# Patient Record
Sex: Female | Born: 1935 | Race: White | Smoking: Never smoker
Health system: Southern US, Community
[De-identification: ages and names within clinical notes are randomized; demographics above are authoritative.]

## PROBLEM LIST (undated history)

## (undated) DIAGNOSIS — F0280 Dementia in other diseases classified elsewhere without behavioral disturbance: Secondary | ICD-10-CM

## (undated) DIAGNOSIS — G309 Alzheimer's disease, unspecified: Secondary | ICD-10-CM

## (undated) DIAGNOSIS — I4891 Unspecified atrial fibrillation: Secondary | ICD-10-CM

## (undated) DIAGNOSIS — M810 Age-related osteoporosis without current pathological fracture: Secondary | ICD-10-CM

---

## 2013-07-28 ENCOUNTER — Other Ambulatory Visit (HOSPITAL_COMMUNITY): Payer: Self-pay | Admitting: Geriatric Medicine

## 2013-07-28 DIAGNOSIS — F0280 Dementia in other diseases classified elsewhere without behavioral disturbance: Secondary | ICD-10-CM

## 2013-07-28 DIAGNOSIS — R1310 Dysphagia, unspecified: Secondary | ICD-10-CM

## 2013-08-01 ENCOUNTER — Ambulatory Visit (HOSPITAL_COMMUNITY)
Admission: RE | Admit: 2013-08-01 | Discharge: 2013-08-01 | Disposition: A | Discharge status: Home / Self Care | Payer: Medicare Other | Source: Ambulatory Visit | Attending: Geriatric Medicine | Admitting: Geriatric Medicine

## 2013-08-01 DIAGNOSIS — F0280 Dementia in other diseases classified elsewhere without behavioral disturbance: Secondary | ICD-10-CM

## 2013-08-01 DIAGNOSIS — R1310 Dysphagia, unspecified: Secondary | ICD-10-CM

## 2013-08-01 DIAGNOSIS — G309 Alzheimer's disease, unspecified: Secondary | ICD-10-CM | POA: Insufficient documentation

## 2013-08-01 NOTE — Procedures (Signed)
Objective Swallowing Evaluation: Modified Barium Swallowing Study  Patient Details  Name: Paige Barton MRN: [id number] Date of Birth: 02/13/1925  Today's Date: 08/01/2013 Time: 8469-6295 SLP Time Calculation (min): 32 min  Past Medical History: No past medical history on file. Past Surgical History: No past surgical history on file. HPI:  79 yo pt who resides at [facility] presents for MBS due to concerns pt is having dysphagia/aspiration as pt frequently coughs with liquids.  Per med tech [place], pt eats only small amounts at a time and has been on nutrition risk recently - she has recently stabilized per  Britta Mccreedy.  Pt denies problems swallowing.  Her medical hx includes Alzheimers disease, Afib, right clavicle fx, osteoporosis, HLD.  Pt is on a PPI since 12/25/12 per chart review.   Adequate intake has been on and off for one year per Britta Mccreedy, med tech.      Assessment / Plan / Recommendation Clinical Impression  Dysphagia Diagnosis: Suspected primary esophageal dysphagia;Mild oral phase dysphagia;Mild pharyngeal phase dysphagia Clinical impression: Pt presents with minimal oropharyngeal dysphagia and suspected primary esophageal deficits.  Delayed oral transiting was intermittently present and pt extends head upward to aid oral transit of liquids.  No aspiration or penetration of barium observed and pt observed to clear her throat prior to MBS and cough prior to and during MBS.  Mild pharyngeal stasis noted - most especially with puree/liquids but effectively was minimized with dry and liquid swallows.  Upon further questioning of pt, pt reports she does occasionally have to swallow twice when she senses something in her throat.  During MBS testing when pharynx was clear and distal esophagus appeared with stasis - pt sensed stasis at pharynx - suspect referrant d/t vagal nerve involvement.  Rec to incorporate aspiration/esophageal strategies to mitigate dysphagia/aspiration and  consider dedicated esophageal evaluation.  Skilled intervention included reinforcement of effective compensation strategies and education during testing.  Thanks for this referral.     Treatment Recommendation    consider esophageal work up, continue slp in current environment   Diet Recommendation Regular;Thin liquid   Liquid Administration via: Cup;Straw Medication Administration: Whole meds with puree (or cut/crushed if large and not contraindicated, follow w/liquid) Supervision: Patient able to self feed;Full supervision/cueing for compensatory strategies (initially recommend full supervision for carryover of strategies if able) Compensations: Slow rate;Small sips/bites;Follow solids with liquid (intermittent dry swallow) Postural Changes and/or Swallow Maneuvers: Seated upright 90 degrees;Upright 30-60 min after meal    Other  Recommendations Recommended Consults: Consider esophageal assessment Oral Care Recommendations: Oral care BID   Follow Up Recommendations   (defer to referring slp)           SLP Swallow Goals     General Date of Onset: 08/01/13 HPI: 79 yo pt who resides at [facility] presents for MBS due to concerns pt is having dysphagia/aspiration as pt frequently coughs with liquids.  Per med tech [place], pt eats only small amounts at a time and has been on nutrition risk recently - she has recently stabilized per  Britta Mccreedy.  Pt denies problems swallowing.  Her medical hx includes Alzheimers disease, Afib, right clavicle fx, osteoporosis, HLD.  Pt is on a PPI since 12/25/12 per chart review.   Adequate intake has been on and off for one year per Britta Mccreedy, med tech.  Type of Study: Modified Barium Swallowing Study Reason for Referral: Objectively evaluate swallowing function Diet Prior to this Study: Regular;Thin liquids Temperature Spikes Noted: No Respiratory Status: Room  air History of Recent Intubation: No Behavior/Cognition: Alert;Cooperative;Pleasant  mood Oral Cavity - Dentition: Dentures, top (7 lower teeth) Oral Motor / Sensory Function: Within functional limits Self-Feeding Abilities: Able to feed self Patient Positioning: Upright in chair Baseline Vocal Quality: Clear Volitional Cough: Strong Volitional Swallow: Able to elicit Anatomy: Within functional limits Pharyngeal Secretions: Not observed secondary MBS    Reason for Referral Objectively evaluate swallowing function   Oral Phase Oral Preparation/Oral Phase Oral Phase: Impaired (pt takes very small bites/sips, pt extends head up with liquids, ? to aid transiting, pt states long term habit; piecemeal deglutition- functional,) Oral - Nectar Oral - Nectar Cup: Delayed oral transit Oral - Nectar Straw: Delayed oral transit Oral - Thin Oral - Thin Teaspoon: Delayed oral transit Oral - Thin Cup: Delayed oral transit Oral - Thin Straw: Delayed oral transit Oral - Solids Oral - Puree: Within functional limits (pt took six small bites sequentially) Oral - Regular: Impaired mastication;Delayed oral transit Oral Phase - Comment Oral Phase - Comment: mild oral delay   Pharyngeal Phase Pharyngeal Phase Pharyngeal Phase: Impaired Pharyngeal - Nectar Pharyngeal - Nectar Cup: Within functional limits;Pharyngeal residue - valleculae Pharyngeal - Nectar Straw: Within functional limits;Pharyngeal residue - valleculae Pharyngeal - Thin Pharyngeal - Thin Teaspoon: Within functional limits Pharyngeal - Thin Cup: Within functional limits Pharyngeal - Thin Straw: Within functional limits Pharyngeal - Solids Pharyngeal - Puree: Within functional limits;Pharyngeal residue - valleculae Pharyngeal - Regular: Within functional limits Pharyngeal Phase - Comment Pharyngeal Comment: mild pharyngeal residuals observed intermittently that cleared with further swallows (from piecemealing)  Cervical Esophageal Phase    GO    Cervical Esophageal Phase Cervical Esophageal Phase:  Impaired Cervical Esophageal Phase - Nectar Nectar Cup: Prominent cricopharyngeal segment Nectar Straw: Prominent cricopharyngeal segment Cervical Esophageal Phase - Thin Thin Teaspoon: Prominent cricopharyngeal segment Thin Cup: Prominent cricopharyngeal segment Thin Straw: Prominent cricopharyngeal segment Cervical Esophageal Phase - Comment Cervical Esophageal Comment: Appearance of decreased clearance throughout esophagus with sensation to pharynx intermittently (or not sensed at all).  Liquid and dry swallows aided esophageal clearance marginally but with retrograde propulsion.  Esophagus did not fully clear during entire testing and pt observed to belch after testing.  Pt may benefit from dedicated esophageal evaluation if MD agrees.     Functional Assessment Tool Used: mbs, clinical judgement Functional Limitations: Swallowing Swallow Current Status ([id number]): At least 40 percent but less than 60 percent impaired, limited or restricted Swallow Goal Status [phone]): At least 40 percent but less than 60 percent impaired, limited or restricted Swallow Discharge Status [phone]): At least 40 percent but less than 60 percent impaired, limited or restricted    Donavan Burnet, MS [facility] SLP [phone]

## 2014-03-21 ENCOUNTER — Emergency Department (HOSPITAL_BASED_OUTPATIENT_CLINIC_OR_DEPARTMENT_OTHER): Payer: Medicare Other

## 2014-03-21 ENCOUNTER — Emergency Department (HOSPITAL_BASED_OUTPATIENT_CLINIC_OR_DEPARTMENT_OTHER)
Admission: EM | Admit: 2014-03-21 | Discharge: 2014-03-22 | Disposition: A | Discharge status: Home / Self Care | Payer: Medicare Other | Attending: Emergency Medicine | Admitting: Emergency Medicine

## 2014-03-21 ENCOUNTER — Encounter (HOSPITAL_BASED_OUTPATIENT_CLINIC_OR_DEPARTMENT_OTHER): Payer: Self-pay | Admitting: Emergency Medicine

## 2014-03-21 DIAGNOSIS — R296 Repeated falls: Secondary | ICD-10-CM | POA: Insufficient documentation

## 2014-03-21 DIAGNOSIS — W19XXXA Unspecified fall, initial encounter: Secondary | ICD-10-CM

## 2014-03-21 DIAGNOSIS — Y939 Activity, unspecified: Secondary | ICD-10-CM | POA: Insufficient documentation

## 2014-03-21 DIAGNOSIS — Z8739 Personal history of other diseases of the musculoskeletal system and connective tissue: Secondary | ICD-10-CM | POA: Insufficient documentation

## 2014-03-21 DIAGNOSIS — Z8679 Personal history of other diseases of the circulatory system: Secondary | ICD-10-CM | POA: Insufficient documentation

## 2014-03-21 DIAGNOSIS — Y929 Unspecified place or not applicable: Secondary | ICD-10-CM | POA: Insufficient documentation

## 2014-03-21 DIAGNOSIS — IMO0002 Reserved for concepts with insufficient information to code with codable children: Secondary | ICD-10-CM | POA: Insufficient documentation

## 2014-03-21 DIAGNOSIS — S0121XA Laceration without foreign body of nose, initial encounter: Secondary | ICD-10-CM

## 2014-03-21 DIAGNOSIS — F0280 Dementia in other diseases classified elsewhere without behavioral disturbance: Secondary | ICD-10-CM | POA: Diagnosis not present

## 2014-03-21 DIAGNOSIS — G309 Alzheimer's disease, unspecified: Secondary | ICD-10-CM | POA: Diagnosis not present

## 2014-03-21 DIAGNOSIS — S022XXA Fracture of nasal bones, initial encounter for closed fracture: Secondary | ICD-10-CM | POA: Diagnosis not present

## 2014-03-21 DIAGNOSIS — R000 Tachycardia, unspecified: Secondary | ICD-10-CM | POA: Diagnosis not present

## 2014-03-21 DIAGNOSIS — N390 Urinary tract infection, site not specified: Secondary | ICD-10-CM | POA: Diagnosis not present

## 2014-03-21 DIAGNOSIS — S0120XA Unspecified open wound of nose, initial encounter: Secondary | ICD-10-CM | POA: Diagnosis present

## 2014-03-21 HISTORY — DX: Dementia in other diseases classified elsewhere, unspecified severity, without behavioral disturbance, psychotic disturbance, mood disturbance, and anxiety: F02.80

## 2014-03-21 HISTORY — DX: Age-related osteoporosis without current pathological fracture: M81.0

## 2014-03-21 HISTORY — DX: Unspecified atrial fibrillation: I48.91

## 2014-03-21 HISTORY — DX: Alzheimer's disease, unspecified: G30.9

## 2014-03-21 LAB — URINE MICROSCOPIC-ADD ON

## 2014-03-21 LAB — URINALYSIS, ROUTINE W REFLEX MICROSCOPIC
BILIRUBIN URINE: NEGATIVE
Glucose, UA: NEGATIVE mg/dL
Hgb urine dipstick: NEGATIVE
Ketones, ur: NEGATIVE mg/dL
Leukocytes, UA: NEGATIVE
NITRITE: POSITIVE — AB
Protein, ur: NEGATIVE mg/dL
Specific Gravity, Urine: 1.011 (ref 1.005–1.030)
UROBILINOGEN UA: 2 mg/dL — AB (ref 0.0–1.0)
pH: 6 (ref 5.0–8.0)

## 2014-03-21 MED ORDER — CEPHALEXIN 250 MG PO CAPS
500.0000 mg | ORAL_CAPSULE | Freq: Once | ORAL | Status: AC
  Administered 2014-03-21: 500 mg via ORAL
  Filled 2014-03-21: qty 2

## 2014-03-21 MED ORDER — CEPHALEXIN 500 MG PO CAPS: 500.0000 mg | ORAL_CAPSULE | Freq: Two times a day (BID) | ORAL | Status: AC

## 2014-03-21 MED ORDER — FOSFOMYCIN TROMETHAMINE 3 G PO PACK
3.0000 g | PACK | Freq: Once | ORAL | Status: DC
  Filled 2014-03-21: qty 3

## 2014-03-21 NOTE — ED Notes (Signed)
Patient transported to CT via stretcher per tech. 

## 2014-03-21 NOTE — Discharge Instructions (Signed)
As discussed, you have a broken nose.  Is important to follow up with our ENT specialist in one week for further evaluation and management.  The lacerations have been repaired withsterile tape.  This will fall off when the time is appropriate.  Please do not was excessively, prescription for scrub the nose until your nose has improved substantially.

## 2014-03-21 NOTE — ED Provider Notes (Addendum)
CSN: [id number]     Arrival date & time 03/21/14  2117 History  This chart was scribed for Gerhard Munchobert Lockwood, MD by Phillis HaggisGabriella Gaje, ED Scribe. This patient was seen in room MH03/MH03 and patient care was started at 9:29 PM.   Chief Complaint  Patient presents with  . Fall   The history is provided by the patient. No language interpreter was used.   HPI Comments: Paige Barton is a 80 y.o. female who presents to the Emergency Department complaining of a unwitnessed fall that occurred immediately prior to [facility]. Patient presents with nasal pain and an associated abrasion and laceration to her nose. Patient does not remember the fall and states that she is not sure whether she had lightheadedness or dizziness earlier today. She denies being in any pain, dyspnea, nausea or visual disturbances. Currently, patient says that she feels well. She has a history of atrial fibrillation, Alzheimer's and osteoporosis.  Past Medical History  Diagnosis Date  . A-fib   . Alzheimer's dementia   . Osteoporosis    History reviewed. No pertinent past surgical history. No family history on file. History  Substance Use Topics  . Smoking status: Never Smoker   . Smokeless tobacco: Not on file  . Alcohol Use: No   OB History   Grav Para Term Preterm Abortions TAB SAB Ect Mult Living                 Review of Systems  Unable to perform ROS: Dementia      Allergies  Review of patient's allergies indicates no known allergies.  Home Medications   Prior to Admission medications   Not on File   Triage Vitals: BP 99/55  Pulse 127  Temp(Src) 98.4 F (36.9 C) (Oral)  Resp 16  Ht 5\' 2"  (1.575 m)  SpO2 97% Physical Exam  Nursing note and vitals reviewed. Constitutional: She is oriented to person, place, and time. She appears well-developed and well-nourished. No distress.  HENT:  Head: Normocephalic. Head is with abrasion.  Nose: Nose lacerations  present. No nasal septal hematoma.  Edema to nose. 4 cm V-shaped laceration with apex at the superior part. Abrasion at the bottom of the nose. No septal hematoma.   Eyes: Conjunctivae and EOM are normal.  Neck: Neck supple. No spinous process tenderness and no muscular tenderness present.  Cardiovascular: Regular rhythm.  Tachycardia present.   Pulmonary/Chest: Effort normal and breath sounds normal. No stridor. No respiratory distress.  Clear breath sounds bilateral  Abdominal: She exhibits no distension.  Musculoskeletal: She exhibits no edema.  Neurological: She is alert and oriented to person, place, and time. No cranial nerve deficit.  Skin: Skin is warm and dry.  Psychiatric: She has a normal mood and affect.    ED Course  Procedures (including critical care time)  COORDINATION OF CARE: 9:32 PM-Discussed treatment plan which includes sutures and head CT with pt at bedside and pt agreed to plan.   Labs Review Labs Reviewed  URINALYSIS, ROUTINE W REFLEX MICROSCOPIC - Abnormal; Notable for the following:    APPearance CLOUDY (*)    Urobilinogen, UA 2.0 (*)    Nitrite POSITIVE (*)    All other components within normal limits  URINE MICROSCOPIC-ADD ON - Abnormal; Notable for the following:    Bacteria, UA MANY (*)    All other components within normal limits    Imaging Review Ct Head Wo Contrast  03/21/2014  CLINICAL DATA:  Fall, head trauma.  Facial laceration.  EXAM: CT HEAD WITHOUT CONTRAST  CT MAXILLOFACIAL WITHOUT CONTRAST  TECHNIQUE: Multidetector CT imaging of the head and maxillofacial structures were performed using the standard protocol without intravenous contrast. Multiplanar CT image reconstructions of the maxillofacial structures were also generated.  COMPARISON:  MRI of the brain April 25, 2009  FINDINGS: CT HEAD FINDINGS  The ventricles and sulci are normal for age. No intraparenchymal hemorrhage, mass effect nor midline shift. Patchy supratentorial white matter  hypodensities are less than expected for patient's age and though non-specific suggest sequelae of chronic small vessel ischemic disease. No acute large vascular territory infarcts.  No abnormal extra-axial fluid collections. Basal cisterns are patent. Mild calcific atherosclerosis of the carotid siphons. No skull fracture. Soft tissue within the left external auditory canal may reflect cerumen.  CT MAXILLOFACIAL FINDINGS  Mandible is intact, the condyles are located.  Bilateral nondisplaced nasal bone fractures, extending to the osseous nasal spine, sparing of the nasal process of the maxilla. No additional facial fractures identified.  Patient is nearly edentulous.Severe temporomandibular osteoarthrosis. Osteopenia.  Ocular globes and orbital contents are nonsuspicious, tiny calcification along the medial left ocular globe.  Mild nasal soft tissue swelling, predominately on the right with skin defect suggesting laceration along the right pre malar soft tissues. Minimal associated subcutaneous gas without radiopaque foreign bodies.  IMPRESSION: CT head:  No acute intracranial process.  Normal noncontrast CT of the head for age, stable.  CT maxillofacial: Bilateral nondisplaced nasal bone fractures.   Electronically Signed   By: Awilda Metroourtnay  Bloomer   On: 03/21/2014 22:30   Ct Maxillofacial Wo Cm  03/21/2014   CLINICAL DATA:  Fall, head trauma.  Facial laceration.  EXAM: CT HEAD WITHOUT CONTRAST  CT MAXILLOFACIAL WITHOUT CONTRAST  TECHNIQUE: Multidetector CT imaging of the head and maxillofacial structures were performed using the standard protocol without intravenous contrast. Multiplanar CT image reconstructions of the maxillofacial structures were also generated.  COMPARISON:  MRI of the brain April 25, 2009  FINDINGS: CT HEAD FINDINGS  The ventricles and sulci are normal for age. No intraparenchymal hemorrhage, mass effect nor midline shift. Patchy supratentorial white matter hypodensities are less than expected  for patient's age and though non-specific suggest sequelae of chronic small vessel ischemic disease. No acute large vascular territory infarcts.  No abnormal extra-axial fluid collections. Basal cisterns are patent. Mild calcific atherosclerosis of the carotid siphons. No skull fracture. Soft tissue within the left external auditory canal may reflect cerumen.  CT MAXILLOFACIAL FINDINGS  Mandible is intact, the condyles are located.  Bilateral nondisplaced nasal bone fractures, extending to the osseous nasal spine, sparing of the nasal process of the maxilla. No additional facial fractures identified.  Patient is nearly edentulous.Severe temporomandibular osteoarthrosis. Osteopenia.  Ocular globes and orbital contents are nonsuspicious, tiny calcification along the medial left ocular globe.  Mild nasal soft tissue swelling, predominately on the right with skin defect suggesting laceration along the right pre malar soft tissues. Minimal associated subcutaneous gas without radiopaque foreign bodies.  IMPRESSION: CT head:  No acute intracranial process.  Normal noncontrast CT of the head for age, stable.  CT maxillofacial: Bilateral nondisplaced nasal bone fractures.   Electronically Signed   By: Awilda Metroourtnay  Bloomer   On: 03/21/2014 22:30   LACERATION REPAIR Performed by: Gerhard MunchLOCKWOOD, ROBERT Authorized by: Gerhard MunchLOCKWOOD, ROBERT Consent: Verbal consent obtained. Risks and benefits: risks, benefits and alternatives were discussed Consent given by: patient Patient identity confirmed: provided demographic data Prepped  and Draped in normal sterile fashion Wound explored  Laceration Location: nose  Laceration Length: ~6cm total - macerated edges throughout  No Foreign Bodies seen or palpated  Irrigation method: syringe Amount of cleaning: standard  Skin closure: steri-strips  Number of sutures: 5 strips  Technique: rough approximation of macerated edges of laceration / abrasion / avulsion  Patient tolerance:  Patient tolerated the procedure well with no immediate complications.   Family requests addition of dermabond to laceration / macerated area. This was accommodated.   MDM   Final diagnoses:  Fall, initial encounter  Fracture of nasal bone, closed, initial encounter  Laceration of nose, initial encounter  UTI (lower urinary tract infection)    I personally performed the services described in this documentation, which was scribed in my presence. The recorded information has been reviewed and is accurate.   This patient with dementia presents from her nursing facility after a fall.  On exam patient is awake alert, moving all extremity spontaneously. The patient's dementia limits the history of present illness somewhat, history is corroborated by family members. Patient's evaluation demonstrates nasal fracture, urinary tract infection.  After repair of her lacerations, she is discharged in stable condition to the care of her family members.   Gerhard Munch, MD 03/21/14 2351  Gerhard Munch, MD 03/22/14 [phone]

## 2014-03-21 NOTE — ED Notes (Signed)
Per EMS pt from [facility], states unwittnessed fall, ?LOC, abrasion to bridge of nose, bleeding controlled; denies injury

## 2014-03-22 DIAGNOSIS — S0120XA Unspecified open wound of nose, initial encounter: Secondary | ICD-10-CM | POA: Diagnosis not present

## 2014-03-22 NOTE — ED Notes (Signed)
Pt picking at steristrips intermittently.  Family members concerned that pt will pull wound apart.  MD notified of concerns and at bedside speaking with them.

## 2014-03-29 DEATH — deceased

## 2016-02-16 IMAGING — CT CT MAXILLOFACIAL W/O CM
1 series · 1 of 2 positions shown · non-contrast
Comparison: MRI of the brain April 25, 2009

CLINICAL DATA: Fall, head trauma.  Facial laceration.

EXAM:
CT HEAD WITHOUT CONTRAST
CT MAXILLOFACIAL WITHOUT CONTRAST
TECHNIQUE: Multidetector CT imaging of the head and maxillofacial structures
were performed using the standard protocol without intravenous
contrast. Multiplanar CT image reconstructions of the maxillofacial
structures were also generated.

[Series 1: topogram 0.6 t20s · sagittal · 1.00mm/px · 1 of 2 slices shown]
[im 2/2]
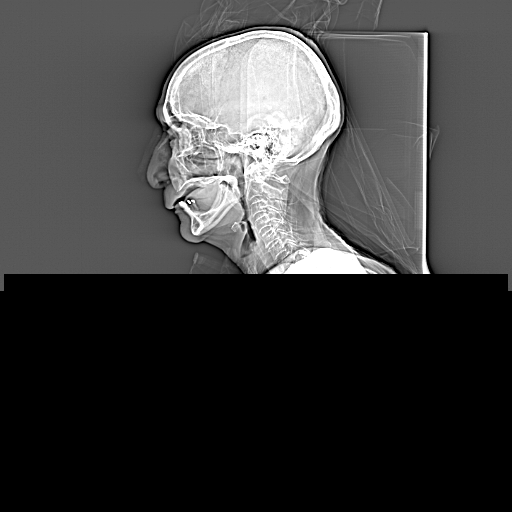

[1 of 2 positions shown; findings below may reference images not displayed]

FINDINGS: CT HEAD FINDINGS

The ventricles and sulci are normal for age. No intraparenchymal
hemorrhage, mass effect nor midline shift. Patchy supratentorial
white matter hypodensities are less than expected for patient's age
and though non-specific suggest sequelae of chronic small vessel
ischemic disease. No acute large vascular territory infarcts.

No abnormal extra-axial fluid collections. Basal cisterns are
patent. Mild calcific atherosclerosis of the carotid siphons. No
skull fracture. Soft tissue within the left external auditory canal
may reflect cerumen.

CT MAXILLOFACIAL FINDINGS

Mandible is intact, the condyles are located.

Bilateral nondisplaced nasal bone fractures, extending to the
osseous nasal spine, sparing of the nasal process of the maxilla. No
additional facial fractures identified.

Patient is nearly edentulous.Severe temporomandibular
osteoarthrosis. Osteopenia.

Ocular globes and orbital contents are nonsuspicious, tiny
calcification along the medial left ocular globe.

Mild nasal soft tissue swelling, predominately on the right with
skin defect suggesting laceration along the right pre malar soft
tissues. Minimal associated subcutaneous gas without radiopaque
foreign bodies.
IMPRESSION: CT head:  No acute intracranial process.

Normal noncontrast CT of the head for age, stable.

CT maxillofacial: Bilateral nondisplaced nasal bone fractures.

  By: Zahira Marciano
